# Patient Record
Sex: Male | Born: 1995 | Hispanic: No | Marital: Single
Health system: Southern US, Community
[De-identification: ages and names within clinical notes are randomized; demographics above are authoritative.]

## PROBLEM LIST (undated history)

## (undated) DIAGNOSIS — J45909 Unspecified asthma, uncomplicated: Secondary | ICD-10-CM

## (undated) HISTORY — DX: Unspecified asthma, uncomplicated: J45.909

---
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
## 2017-03-22 ENCOUNTER — Emergency Department (HOSPITAL_COMMUNITY): Payer: Self-pay

## 2017-03-22 ENCOUNTER — Emergency Department (HOSPITAL_COMMUNITY)
Admission: EM | Admit: 2017-03-22 | Discharge: 2017-03-22 | Disposition: A | Discharge status: Home / Self Care | Payer: Self-pay | Attending: Emergency Medicine | Admitting: Emergency Medicine

## 2017-03-22 ENCOUNTER — Encounter (HOSPITAL_COMMUNITY): Payer: Self-pay | Admitting: Emergency Medicine

## 2017-03-22 DIAGNOSIS — Y999 Unspecified external cause status: Secondary | ICD-10-CM | POA: Insufficient documentation

## 2017-03-22 DIAGNOSIS — Y939 Activity, unspecified: Secondary | ICD-10-CM | POA: Insufficient documentation

## 2017-03-22 DIAGNOSIS — J45909 Unspecified asthma, uncomplicated: Secondary | ICD-10-CM | POA: Insufficient documentation

## 2017-03-22 DIAGNOSIS — Z23 Encounter for immunization: Secondary | ICD-10-CM | POA: Insufficient documentation

## 2017-03-22 DIAGNOSIS — Z5181 Encounter for therapeutic drug level monitoring: Secondary | ICD-10-CM | POA: Insufficient documentation

## 2017-03-22 DIAGNOSIS — S61210A Laceration without foreign body of right index finger without damage to nail, initial encounter: Secondary | ICD-10-CM | POA: Insufficient documentation

## 2017-03-22 DIAGNOSIS — F10920 Alcohol use, unspecified with intoxication, uncomplicated: Secondary | ICD-10-CM

## 2017-03-22 DIAGNOSIS — S51811A Laceration without foreign body of right forearm, initial encounter: Secondary | ICD-10-CM | POA: Insufficient documentation

## 2017-03-22 DIAGNOSIS — S60221A Contusion of right hand, initial encounter: Secondary | ICD-10-CM | POA: Insufficient documentation

## 2017-03-22 DIAGNOSIS — F10120 Alcohol abuse with intoxication, uncomplicated: Secondary | ICD-10-CM | POA: Insufficient documentation

## 2017-03-22 DIAGNOSIS — Y92009 Unspecified place in unspecified non-institutional (private) residence as the place of occurrence of the external cause: Secondary | ICD-10-CM | POA: Insufficient documentation

## 2017-03-22 LAB — COMPREHENSIVE METABOLIC PANEL
ALBUMIN: 4.8 g/dL (ref 3.5–5.0)
ALK PHOS: 82 U/L (ref 38–126)
ALT: 37 U/L (ref 17–63)
ANION GAP: 12 (ref 5–15)
AST: 39 U/L (ref 15–41)
BILIRUBIN TOTAL: 0.7 mg/dL (ref 0.3–1.2)
BUN: 17 mg/dL (ref 6–20)
CALCIUM: 9.2 mg/dL (ref 8.9–10.3)
CO2: 20 mmol/L — ABNORMAL LOW (ref 22–32)
CREATININE: 1.11 mg/dL (ref 0.61–1.24)
Chloride: 109 mmol/L (ref 101–111)
GFR calc Af Amer: >60 mL/min (ref >60)
GFR calc non Af Amer: >60 mL/min (ref >60)
GLUCOSE: 117 mg/dL — AB (ref 65–99)
Potassium: 4.1 mmol/L (ref 3.5–5.1)
Sodium: 141 mmol/L (ref 135–145)
TOTAL PROTEIN: 7.4 g/dL (ref 6.5–8.1)

## 2017-03-22 LAB — CBC WITH DIFFERENTIAL/PLATELET
BASOS PCT: 0 %
Basophils Absolute: 0 10*3/uL (ref 0.0–0.1)
EOS ABS: 0 10*3/uL (ref 0.0–0.7)
EOS PCT: 0 %
HCT: 42.2 % (ref 39.0–52.0)
HEMOGLOBIN: 15 g/dL (ref 13.0–17.0)
LYMPHS ABS: 1.7 10*3/uL (ref 0.7–4.0)
Lymphocytes Relative: 11 %
MCH: 32 pg (ref 26.0–34.0)
MCHC: 35.5 g/dL (ref 30.0–36.0)
MCV: 90 fL (ref 78.0–100.0)
MONO ABS: 1 10*3/uL (ref 0.1–1.0)
MONOS PCT: 7 %
NEUTROS PCT: 82 %
Neutro Abs: 13 10*3/uL — ABNORMAL HIGH (ref 1.7–7.7)
PLATELETS: 391 10*3/uL (ref 150–400)
RBC: 4.69 MIL/uL (ref 4.22–5.81)
RDW: 13.3 % (ref 11.5–15.5)
WBC: 15.7 10*3/uL — ABNORMAL HIGH (ref 4.0–10.5)

## 2017-03-22 LAB — RAPID URINE DRUG SCREEN, HOSP PERFORMED
Amphetamines: NOT DETECTED
Barbiturates: NOT DETECTED
Benzodiazepines: NOT DETECTED
COCAINE: NOT DETECTED
OPIATES: NOT DETECTED
Tetrahydrocannabinol: POSITIVE — AB

## 2017-03-22 LAB — ETHANOL: Alcohol, Ethyl (B): 263 mg/dL — ABNORMAL HIGH (ref <5)

## 2017-03-22 MED ORDER — AMOXICILLIN-POT CLAVULANATE 875-125 MG PO TABS: 1.0000 | ORAL_TABLET | Freq: Two times a day (BID) | ORAL | Status: DC

## 2017-03-22 MED ORDER — ALUM & MAG HYDROXIDE-SIMETH 200-200-20 MG/5ML PO SUSP: 30.0000 mL | Freq: Four times a day (QID) | ORAL | Status: DC | PRN

## 2017-03-22 MED ORDER — ACETAMINOPHEN 325 MG PO TABS: 650.0000 mg | ORAL_TABLET | ORAL | Status: DC | PRN

## 2017-03-22 MED ORDER — TETANUS-DIPHTH-ACELL PERTUSSIS 5-2.5-18.5 LF-MCG/0.5 IM SUSP
0.5000 mL | Freq: Once | INTRAMUSCULAR | Status: AC
  Administered 2017-03-22: 0.5 mL via INTRAMUSCULAR
  Filled 2017-03-22: qty 0.5

## 2017-03-22 MED ORDER — AMOXICILLIN-POT CLAVULANATE 875-125 MG PO TABS
1.0000 | ORAL_TABLET | Freq: Once | ORAL | Status: AC
  Administered 2017-03-22: 1 via ORAL
  Filled 2017-03-22: qty 1

## 2017-03-22 MED ORDER — ONDANSETRON HCL 4 MG PO TABS: 4.0000 mg | ORAL_TABLET | Freq: Three times a day (TID) | ORAL | Status: DC | PRN

## 2017-03-22 MED ORDER — IBUPROFEN 200 MG PO TABS: 600.0000 mg | ORAL_TABLET | Freq: Three times a day (TID) | ORAL | Status: DC | PRN

## 2017-03-22 MED ORDER — ZOLPIDEM TARTRATE 5 MG PO TABS: 5.0000 mg | ORAL_TABLET | Freq: Every evening | ORAL | Status: DC | PRN
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
## 2017-03-22 NOTE — ED Notes (Signed)
Patient was offered breakfast tray, patient denied. Patient resting and it no distress at this time.

## 2017-03-22 NOTE — Consult Note (Signed)
I saw the patient this morning in conjunction with Onalee Huaavid, during TTS assessment.  The patient's sister was able to provide valuable collateral. There does not appear to be any acute psychiatric need requiring inpatient admission.  He would benefit from outpatient follow-up for assistance and treatment of binge alcohol use disorder.  Please see TTS note for further details.

## 2017-03-22 NOTE — BH Assessment (Signed)
[facility] Assessment Progress Note  Eksir MD also evaluated patient and case was staffed with that provider who reported that patient does not appear to be any acute psychiatric need requiring inpatient admission. Patient would benefit from outpatient follow-up for assistance and treatment of binge alcohol use disorder. Patient will be discharged later this date and follow up with OP resources provided.

## 2017-03-22 NOTE — ED Notes (Signed)
Bed: [id number] Expected date:  Expected time:  Means of arrival:  Comments: 21yo M ETOH/ASSAULT
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
## 2017-03-22 NOTE — ED Notes (Signed)
Reviewed discharge instructions with patent and family. Patient left with all belongings to go home.
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
## 2021-01-12 ENCOUNTER — Encounter (HOSPITAL_COMMUNITY): Payer: Self-pay | Admitting: Emergency Medicine

## 2021-01-12 ENCOUNTER — Emergency Department (HOSPITAL_COMMUNITY): Payer: Self-pay

## 2021-01-12 ENCOUNTER — Emergency Department (HOSPITAL_COMMUNITY)
Admission: EM | Admit: 2021-01-12 | Discharge: 2021-01-12 | Disposition: A | Discharge status: Home / Self Care | Payer: Self-pay | Attending: Emergency Medicine | Admitting: Emergency Medicine

## 2021-01-12 DIAGNOSIS — S91311A Laceration without foreign body, right foot, initial encounter: Secondary | ICD-10-CM | POA: Insufficient documentation

## 2021-01-12 DIAGNOSIS — W260XXA Contact with knife, initial encounter: Secondary | ICD-10-CM | POA: Insufficient documentation

## 2021-01-12 DIAGNOSIS — J45909 Unspecified asthma, uncomplicated: Secondary | ICD-10-CM | POA: Insufficient documentation

## 2021-01-12 MED ORDER — CEPHALEXIN 500 MG PO CAPS: 500.0000 mg | ORAL_CAPSULE | Freq: Four times a day (QID) | ORAL | 0 refills | Status: AC

## 2021-01-12 MED ORDER — LIDOCAINE HCL (PF) 1 % IJ SOLN
10.0000 mL | Freq: Once | INTRAMUSCULAR | Status: AC
  Administered 2021-01-12: 10 mL via INTRADERMAL
  Filled 2021-01-12: qty 10

## 2021-01-12 MED ORDER — BACITRACIN ZINC 500 UNIT/GM EX OINT: TOPICAL_OINTMENT | Freq: Once | CUTANEOUS | Status: AC

## 2021-01-12 NOTE — ED Provider Notes (Signed)
[facility] EMERGENCY DEPARTMENT Provider Note   CSN: [id number] Arrival date & time: 01/12/21  1220     History No chief complaint on file.   Aaron Turner is a 25 y.o. male presents for evaluation of right foot pain/laceration that occurred just prior to arrival.  Patient reports that he was mowing his lawn and he was wearing flip-flops.  He states that there was a knife that was laying down on the ground and when the lawnmower ran over it, popped up and stabbed him on the medial aspect of his right foot.  He has been able to put some weight and ambulate.  His tetanus was last updated in 2018.  He denies any numbness/weakness.  He is not on blood thinners.  The history is provided by the patient.       Past Medical History:  Diagnosis Date  . Asthma     There are no problems to display for this patient.   No past surgical history on file.     No family history on file.  Social History   Tobacco Use  . Smoking status: Unknown If Ever Smoked  . Smokeless tobacco: Never Used  Substance Use Topics  . Alcohol use: Yes  . Drug use: No    Home Medications Prior to Admission medications   Medication Sig Start Date End Date Taking? Authorizing Provider  cephALEXin (KEFLEX) 500 MG capsule Take 1 capsule (500 mg total) by mouth 4 (four) times daily for 7 days. 01/12/21 01/19/21 Yes Maxwell Caul, PA-C    Allergies    Patient has no known allergies.  Review of Systems   Review of Systems  Skin: Positive for wound.  Neurological: Negative for weakness and numbness.  All other systems reviewed and are negative.   Physical Exam Updated Vital Signs BP (!) 169/108 (BP Location: Right Arm)   Pulse 72   Temp 99 F (37.2 C) (Oral)   Resp 18   SpO2 98%   Physical Exam Vitals and nursing note reviewed.  Constitutional:      Appearance: He is well-developed.  HENT:     Head: Normocephalic and atraumatic.  Eyes:     General: No scleral icterus.        Right eye: No discharge.        Left eye: No discharge.     Conjunctiva/sclera: Conjunctivae normal.  Cardiovascular:     Pulses:          Dorsalis pedis pulses are 2+ on the right side and 2+ on the left side.  Pulmonary:     Effort: Pulmonary effort is normal.  Musculoskeletal:     Comments: Flexion/extension of all 5 digits intact by difficulty.  Specifically, he can fully flex and extend right first toe.  Tenderness palpation noted to the dorsal/medial aspect of the right foot.  Dorsiflexion and plantarflexion intact any difficulty.  Skin:    General: Skin is warm and dry.     Comments: 2.5 cm linear laceration noted to the medial dorsal aspect of the right foot. Good distal cap refill.  RLE is not dusky in appearance or cool to touch.  Neurological:     Mental Status: He is alert.     Comments: Sensation intact along major nerve distributions of BLE  Psychiatric:        Speech: Speech normal.        Behavior: Behavior normal.     ED Results / Procedures / Treatments  Labs (all labs ordered are listed, but only abnormal results are displayed) Labs Reviewed - No data to display  EKG None  Radiology DG Foot Complete Right  Result Date: 01/12/2021 CLINICAL DATA:  Injury with lawn mower EXAM: RIGHT FOOT COMPLETE - 3+ VIEW COMPARISON:  None. FINDINGS: Frontal, oblique, and lateral views obtained. There is overlying bandage. No radiopaque foreign body beyond bandage. No fracture or dislocation. Joint spaces appear normal. No erosive change. IMPRESSION: Overlying bandage. No other radiopaque foreign body. No fracture or dislocation. No evident arthropathy. Electronically Signed   By: Bretta Bang III M.D.   On: 01/12/2021 13:11    Procedures .Marland KitchenLaceration Repair  Date/Time: 01/12/2021 1:19 PM Performed by: Maxwell Caul, PA-C Authorized by: Maxwell Caul, PA-C   Consent:    Consent obtained:  Verbal   Consent given by:  Patient   Risks discussed:  Infection,  need for additional repair, pain, poor cosmetic result and poor wound healing   Alternatives discussed:  No treatment and delayed treatment Universal protocol:    Procedure explained and questions answered to patient or proxy's satisfaction: yes     Relevant documents present and verified: yes     Test results available: yes     Imaging studies available: yes     Required blood products, implants, devices, and special equipment available: yes     Site/side marked: yes     Immediately prior to procedure, a time out was called: yes     Patient identity confirmed:  Verbally with patient Anesthesia:    Anesthesia method:  Local infiltration   Local anesthetic:  Lidocaine 1% WITH epi Laceration details:    Location:  Foot   Foot location:  Top of R foot   Length (cm):  2.5 Pre-procedure details:    Preparation:  Patient was prepped and draped in usual sterile fashion Exploration:    Hemostasis achieved with:  Direct pressure   Imaging obtained: x-ray     Imaging outcome: foreign body not noted     Wound extent: no tendon damage noted   Treatment:    Area cleansed with:  Povidone-iodine and saline   Amount of cleaning:  Extensive   Irrigation solution:  Sterile water and sterile saline   Irrigation method:  Syringe   Visualized foreign bodies/material removed: no     Debridement:  None   Undermining:  None   Layers/structures repaired:  Deep subcutaneous Deep subcutaneous:    Suture size:  4-0   Suture material:  Vicryl   Suture technique:  Simple interrupted   Number of sutures:  1 Skin repair:    Repair method:  Sutures   Suture size:  4-0   Suture material:  Prolene and nylon   Suture technique:  Simple interrupted   Number of sutures:  5 Approximation:    Approximation:  Close Repair type:    Repair type:  Simple Post-procedure details:    Dressing:  Antibiotic ointment and non-adherent dressing   Procedure completion:  Tolerated Comments:     Once area was  anesthetized, was thoroughly extensively irrigated with a liter of sterile saline.  No evidence of foreign body in the actual wound though his foot was covered with dirt, debris and grass.  I thoroughly and extensively flushed the wound with sterile saline to ensure that there was no foreign bodies.  There was a small hematoma that was removed.  Evacuation of this showed no evidence of arterial bleeding.  The area was  repaired as documented above.     Medications Ordered in ED Medications  lidocaine (PF) (XYLOCAINE) 1 % injection 10 mL (has no administration in time range)  bacitracin ointment (has no administration in time range)    ED Course  I have reviewed the triage vital signs and the nursing notes.  Pertinent labs & imaging results that were available during my care of the patient were reviewed by me and considered in my medical decision making (see chart for details).    MDM Rules/Calculators/A&P                          25 year old male who presents for evaluation of right foot laceration that occurred earlier this morning.  Reports he was mowing his lawn when a knife that was on the ground was run over by the lawnmower, causing it to stab his foot.  His tetanus is up-to-date.  On initial arrival, he is afebrile nontoxic-appearing.  Vital signs are stable.  On exam, he has tenderness palpation dorsal/medial aspect of the foot with a 2.5 cm laceration noted.  We will plan for wound care, x-ray.  X-ray reviewed.  No acute fracture or dislocation.  Laceration repaired as documented above.  Patient tolerated procedure well.  He states that the knife that hit his foot has been laying on the ground but he is unsure of how long.  Additionally, the foot was very dirty itself. There is no actual foreign body in the wound but given the surrounding circumstances, we will plan to start him on antibiotics.  Patient with no known drug allergies. At this time, patient exhibits no emergent  life-threatening condition that require further evaluation in ED. Patient had ample opportunity for questions and discussion. All patient's questions were answered with full understanding. Strict return precautions discussed. Patient expresses understanding and agreement to plan.   Portions of this note were generated with Scientist, clinical (histocompatibility and immunogenetics). Dictation errors may occur despite best attempts at proofreading.  Final Clinical Impression(s) / ED Diagnoses Final diagnoses:  Laceration of right foot, initial encounter    Rx / DC Orders ED Discharge Orders         Ordered    cephALEXin (KEFLEX) 500 MG capsule  4 times daily        01/12/21 1408           Rosana Hoes 01/12/21 1436    Terald Sleeper, MD 01/12/21 1754

## 2021-01-12 NOTE — ED Triage Notes (Signed)
Pt here with a lac to hid right foot from a knife that he ran over with the lawn mower , able to move toes

## 2021-01-12 NOTE — Discharge Instructions (Addendum)
Keep the wound clean and dry for the first 24 hours. After that you may gently clean the wound with soap and water. Make sure to pat dry the wound before covering it with any dressing. You can use topical antibiotic ointment and bandage. Ice and elevate for pain relief.   You can take Tylenol or Ibuprofen as directed for pain. You can alternate Tylenol and Ibuprofen every 4 hours for additional pain relief.   Take antibiotics as directed. Please take all of your antibiotics until finished.  Return to the Emergency Department, your primary care doctor, or the [facility] in 7-10 days for suture removal.   Monitor closely for any signs of infection. Return to the Emergency Department for any worsening redness/swelling of the area that begins to spread, drainage from the site, worsening pain, fever or any other worsening or concerning symptoms.    

## 2021-01-12 NOTE — ED Provider Notes (Signed)
MSE was initiated and I personally evaluated the patient and placed orders (if any) at  12:30 PM on January 12, 2021.  The patient appears stable so that the remainder of the MSE may be completed by another provider.  Patient reports that he was mowing the lawn in flip flips and cut his right foot when something popped up from the lawn and cut his foot.  Last Tdap 2018.    No numbness, no blood thinners.    Patient advised of the need to stay for full evaluation.   Note: Portions of this report may have been transcribed using voice recognition software. Every effort was made to ensure accuracy; however, inadvertent computerized transcription errors may be present     Norman Clay 01/12/21 1232    Blane Ohara, MD 01/13/21 1020

## 2022-11-27 IMAGING — DX DG FOOT COMPLETE 3+V*R*
3 series · 3 of 3 positions shown · non-contrast
Comparison: None.

CLINICAL DATA: Injury with lawn mower

EXAM:
RIGHT FOOT COMPLETE - 3+ VIEW

[x foot ap right]
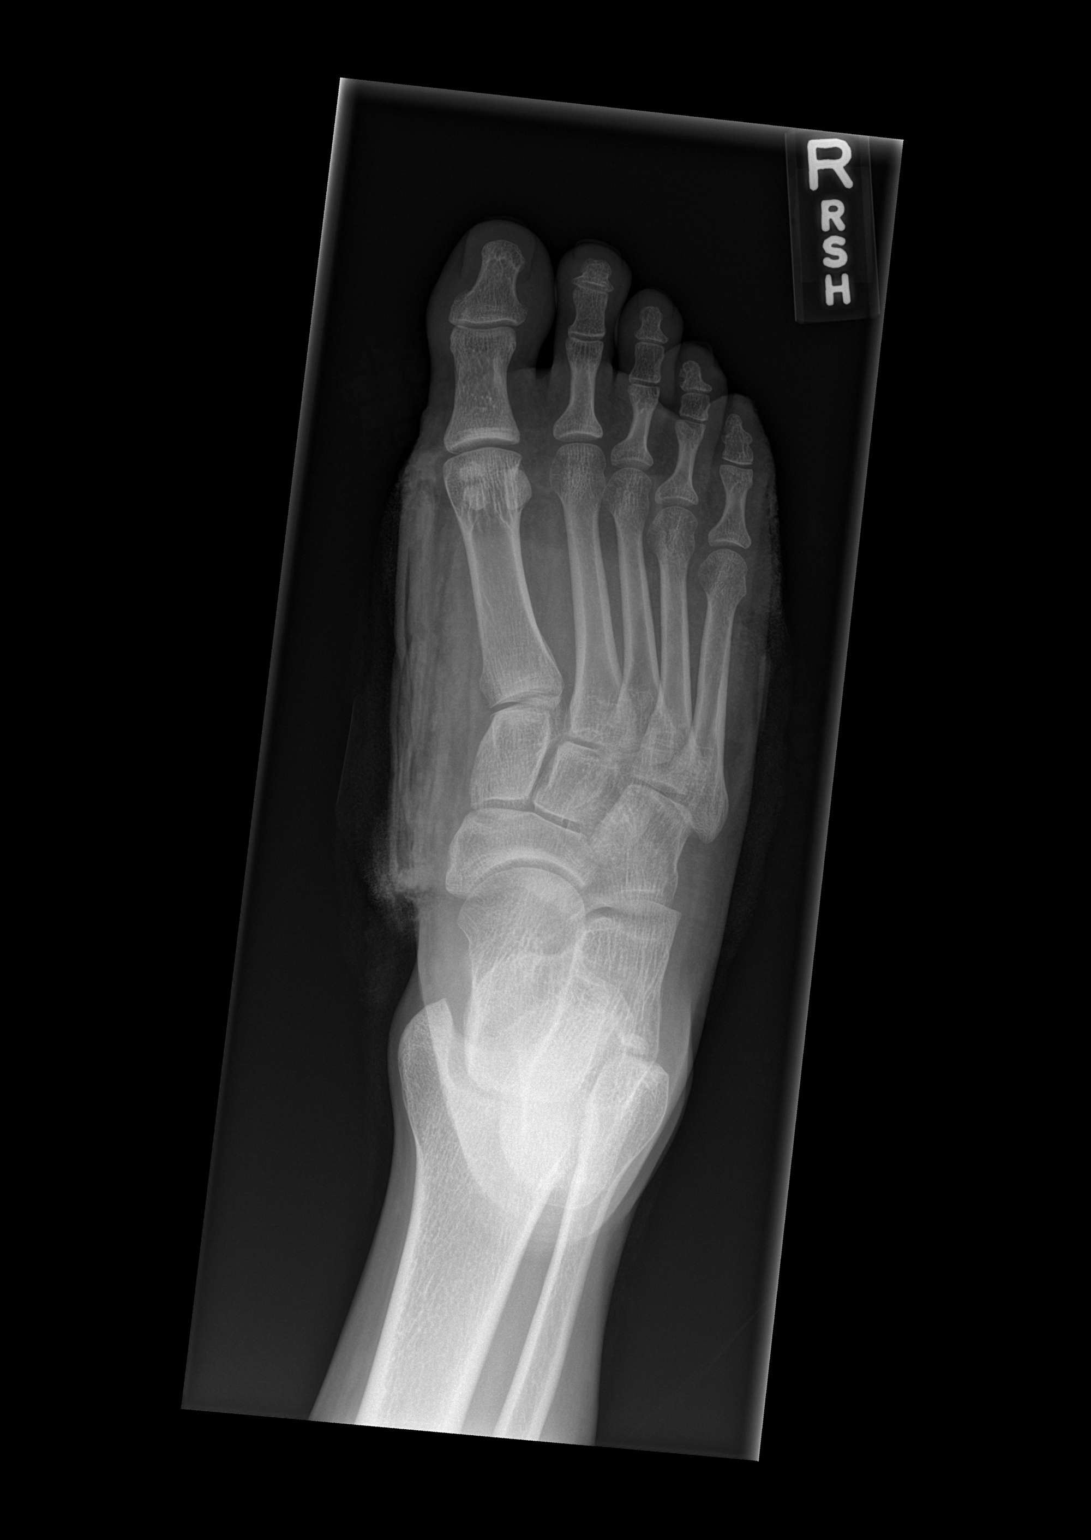

[x foot obl right]
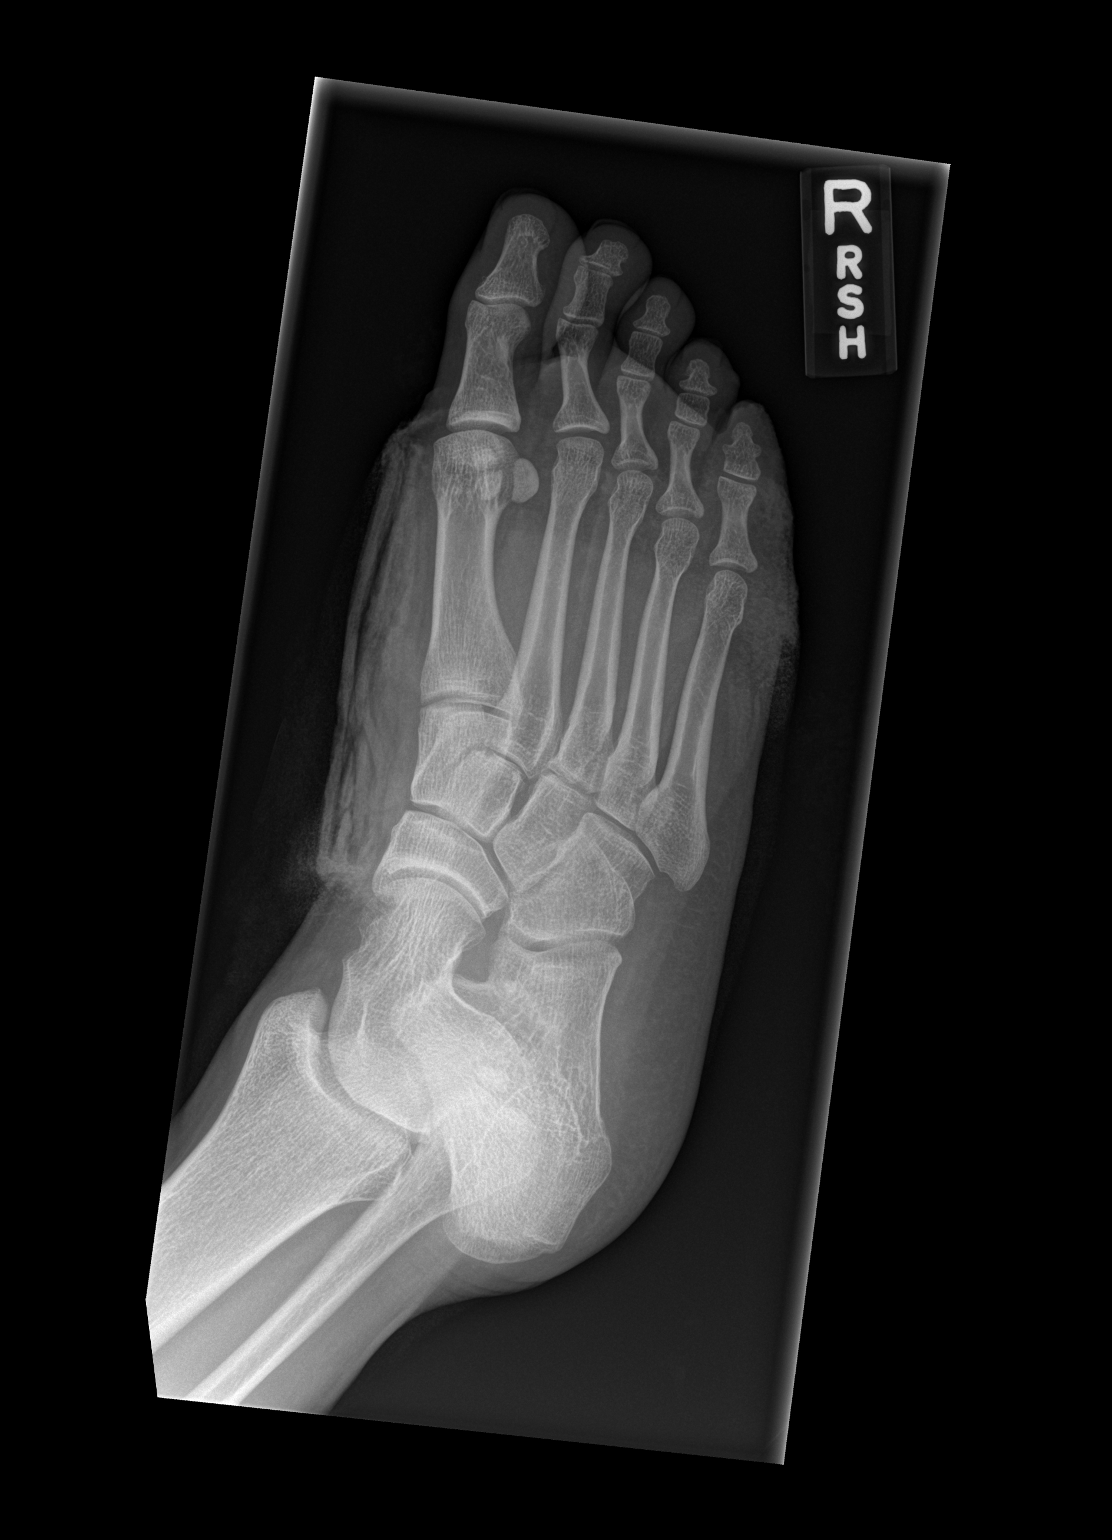

[x foot lat right]
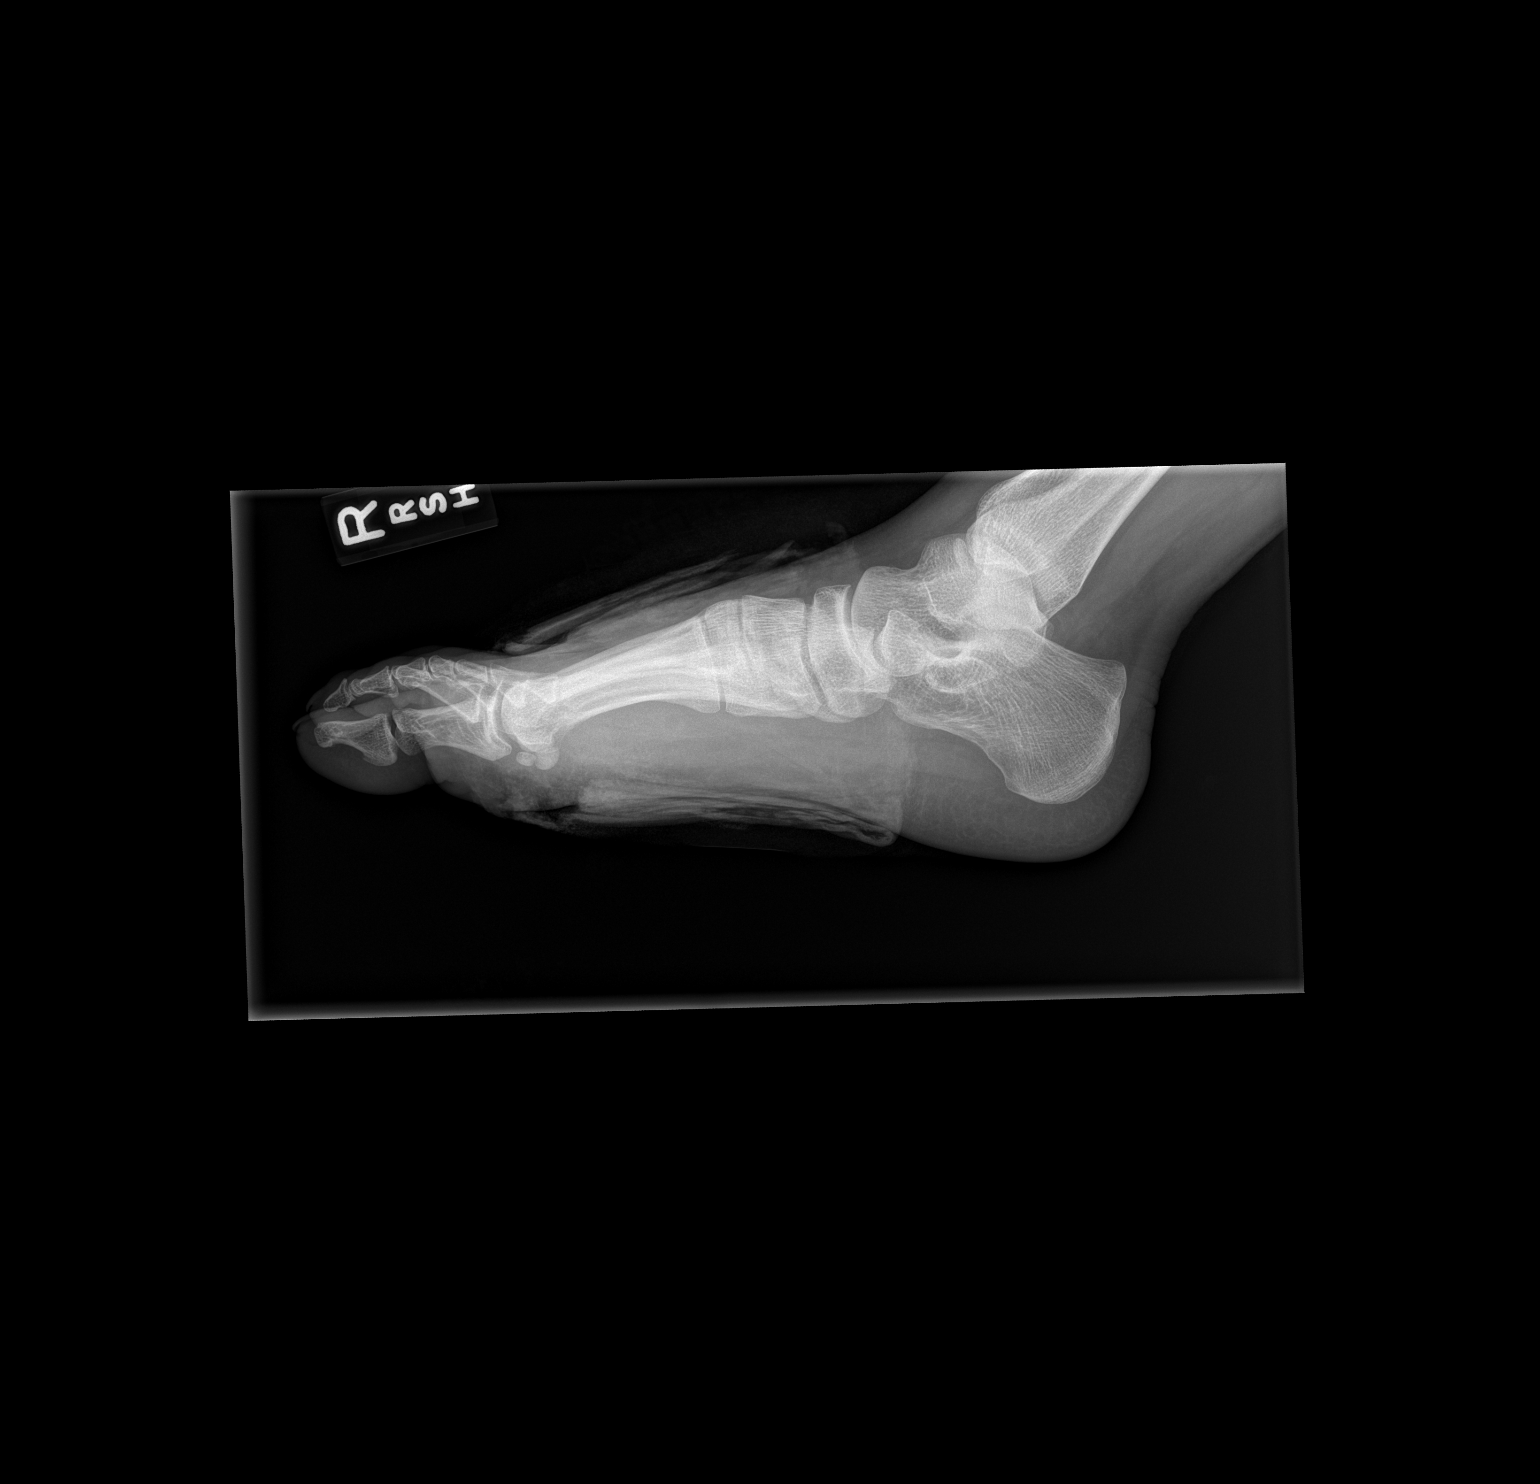

[3 of 3 positions shown; findings below may reference images not displayed]

FINDINGS: Frontal, oblique, and lateral views obtained. There is overlying
bandage. No radiopaque foreign body beyond bandage. No fracture or
dislocation. Joint spaces appear normal. No erosive change.
IMPRESSION: Overlying bandage. No other radiopaque foreign body. No fracture or
dislocation. No evident arthropathy.
